# Patient Record
Sex: Female | Born: 1990 | Race: Black or African American | Hispanic: No | Marital: Single | Smoking: Never smoker
Health system: Southern US, Community
[De-identification: ages and names within clinical notes are randomized; demographics above are authoritative.]

## PROBLEM LIST (undated history)

## (undated) DIAGNOSIS — A749 Chlamydial infection, unspecified: Secondary | ICD-10-CM

## (undated) HISTORY — DX: Chlamydial infection, unspecified: A74.9

## (undated) HISTORY — PX: NO PAST SURGERIES: SHX2092

---
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
## 2016-01-20 ENCOUNTER — Ambulatory Visit (INDEPENDENT_AMBULATORY_CARE_PROVIDER_SITE_OTHER): Payer: BLUE CROSS/BLUE SHIELD | Admitting: Nurse Practitioner

## 2016-01-20 ENCOUNTER — Encounter: Payer: Self-pay | Admitting: Nurse Practitioner

## 2016-01-20 VITALS — BP 114/66 | HR 60 | Ht 66.75 in | Wt 174.0 lb

## 2016-01-20 DIAGNOSIS — Z3009 Encounter for other general counseling and advice on contraception: Secondary | ICD-10-CM | POA: Diagnosis not present

## 2016-01-20 DIAGNOSIS — N390 Urinary tract infection, site not specified: Secondary | ICD-10-CM

## 2016-01-20 DIAGNOSIS — N926 Irregular menstruation, unspecified: Secondary | ICD-10-CM

## 2016-01-20 DIAGNOSIS — Z0000 Encounter for general adult medical examination without abnormal findings: Secondary | ICD-10-CM

## 2016-01-20 DIAGNOSIS — R319 Hematuria, unspecified: Secondary | ICD-10-CM

## 2016-01-20 DIAGNOSIS — A749 Chlamydial infection, unspecified: Secondary | ICD-10-CM

## 2016-01-20 DIAGNOSIS — Z113 Encounter for screening for infections with a predominantly sexual mode of transmission: Secondary | ICD-10-CM | POA: Diagnosis not present

## 2016-01-20 DIAGNOSIS — Z01419 Encounter for gynecological examination (general) (routine) without abnormal findings: Secondary | ICD-10-CM | POA: Diagnosis not present

## 2016-01-20 HISTORY — DX: Chlamydial infection, unspecified: A74.9

## 2016-01-20 LAB — POCT URINALYSIS DIPSTICK
BILIRUBIN UA: NEGATIVE
Glucose, UA: NEGATIVE
KETONES UA: NEGATIVE
Nitrite, UA: NEGATIVE
Urobilinogen, UA: NEGATIVE
pH, UA: 8

## 2016-01-20 LAB — POCT URINE PREGNANCY: Preg Test, Ur: NEGATIVE

## 2016-01-20 MED ORDER — NORETHIN ACE-ETH ESTRAD-FE 1-20 MG-MCG PO TABS: 1.0000 | ORAL_TABLET | Freq: Every day | ORAL | 0 refills | Status: DC

## 2016-01-20 MED ORDER — NITROFURANTOIN MONOHYD MACRO 100 MG PO CAPS: 100.0000 mg | ORAL_CAPSULE | Freq: Two times a day (BID) | ORAL | 0 refills | Status: DC

## 2016-01-20 NOTE — Patient Instructions (Signed)
General topics  Next pap or exam is  due in 1 year Take a Women's multivitamin Take 1200 mg. of calcium daily - prefer dietary If any concerns in interim to call back  Breast Self-Awareness Practicing breast self-awareness may pick up problems early, prevent significant medical complications, and possibly save your life. By practicing breast self-awareness, you can become familiar with how your breasts look and feel and if your breasts are changing. This allows you to notice changes early. It can also offer you some reassurance that your breast health is good. One way to learn what is normal for your breasts and whether your breasts are changing is to do a breast self-exam. If you find a lump or something that was not present in the past, it is best to contact your caregiver right away. Other findings that should be evaluated by your caregiver include nipple discharge, especially if it is bloody; skin changes or reddening; areas where the skin seems to be pulled in (retracted); or new lumps and bumps. Breast pain is seldom associated with cancer (malignancy), but should also be evaluated by a caregiver. BREAST SELF-EXAM The best time to examine your breasts is 5 7 days after your menstrual period is over.  Exit[employer] Patient Information 2013 [employer].   Exercise to Stay Healthy Exercise helps you become and stay healthy. EXERCISE IDEAS AND TIPS Choose exercises that:  You enjoy.  Fit into your day. You do not need to exercise really hard to be healthy. You can do exercises at a slow or medium level and stay healthy. You can:  Stretch before and after working out.  Try yoga, Pilates, or tai chi.  Lift weights.  Walk fast, swim, jog, run, climb stairs, bicycle, dance, or rollerskate.  Take aerobic classes. Exercises that burn about 150 calories:  Running 1  miles in 15 minutes.  Playing volleyball for 45 to 60 minutes.  Washing and waxing a car for 45 to 60  minutes.  Playing touch football for 45 minutes.  Walking 1  miles in 35 minutes.  Pushing a stroller 1  miles in 30 minutes.  Playing basketball for 30 minutes.  Raking leaves for 30 minutes.  Bicycling 5 miles in 30 minutes.  Walking 2 miles in 30 minutes.  Dancing for 30 minutes.  Shoveling snow for 15 minutes.  Swimming laps for 20 minutes.  Walking up stairs for 15 minutes.  Bicycling 4 miles in 15 minutes.  Gardening for 30 to 45 minutes.  Jumping rope for 15 minutes.  Washing windows or floors for 45 to 60 minutes. Document Released: 07/09/2010 Document Revised: 08/29/2011 Document Reviewed: 07/09/2010 [employer] Patient Information 2013 [employer].   Other topics ( that may be useful information):    Sexually Transmitted Disease Sexually transmitted disease (STD) refers to any infection that is passed from person to person during sexual activity. This may happen by way of saliva, semen, blood, vaginal mucus, or urine. Common STDs include:  Gonorrhea.  Chlamydia.  Syphilis.  HIV/AIDS.  Genital herpes.  Hepatitis B and C.  Trichomonas.  Human papillomavirus (HPV).  Pubic lice. CAUSES  An STD may be spread by bacteria, virus, or parasite. A person can get an STD by:  Sexual intercourse with an infected person.  Sharing sex toys with an infected person.  Sharing needles with an infected person.  Having intimate contact with the genitals, mouth, or rectal areas of an infected person. SYMPTOMS  Some people may not have any symptoms, but   they can still pass the infection to others. Different STDs have different symptoms. Symptoms include:  Painful or bloody urination.  Pain in the pelvis, abdomen, vagina, anus, throat, or eyes.  Skin rash, itching, irritation, growths, or sores (lesions). These usually occur in the genital or anal area.  Abnormal vaginal discharge.  Penile discharge in men.  Soft, flesh-colored skin growths in the  genital or anal area.  Fever.  Pain or bleeding during sexual intercourse.  Swollen glands in the groin area.  Yellow skin and eyes (jaundice). This is seen with hepatitis. DIAGNOSIS  To make a diagnosis, your caregiver may:  Take a medical history.  Perform a physical exam.  Take a specimen (culture) to be examined.  Examine a sample of discharge under a microscope.  Perform blood test TREATMENT   Chlamydia, gonorrhea, trichomonas, and syphilis can be cured with antibiotic medicine.  Genital herpes, hepatitis, and HIV can be treated, but not cured, with prescribed medicines. The medicines will lessen the symptoms.  Genital warts from HPV can be treated with medicine or by freezing, burning (electrocautery), or surgery. Warts may come back.  HPV is a virus and cannot be cured with medicine or surgery.However, abnormal areas may be followed very closely by your caregiver and may be removed from the cervix, vagina, or vulva through office procedures or surgery. If your diagnosis is confirmed, your recent sexual partners need treatment. This is true even if they are symptom-free or have a negative culture or evaluation. They should not have sex until their caregiver says it is okay. HOME CARE INSTRUCTIONS  All sexual partners should be informed, tested, and treated for all STDs.  Take your antibiotics as directed. Finish them even if you start to feel better.  Only take over-the-counter or prescription medicines for pain, discomfort, or fever as directed by your caregiver.  Rest.  Eat a balanced diet and drink enough fluids to keep your urine clear or pale yellow.  Do not have sex until treatment is completed and you have followed up with your caregiver. STDs should be checked after treatment.  Keep all follow-up appointments, Pap tests, and blood tests as directed by your caregiver.  Only use latex condoms and water-soluble lubricants during sexual activity. Do not use  petroleum jelly or oils.  Avoid alcohol and illegal drugs.  Get vaccinated for HPV and hepatitis. If you have not received these vaccines in the past, talk to your caregiver about whether one or both might be right for you.  Avoid risky sex practices that can break the skin. The only way to avoid getting an STD is to avoid all sexual activity.Latex condoms and dental dams (for oral sex) will help lessen the risk of getting an STD, but will not completely eliminate the risk. SEEK MEDICAL CARE IF:   You have a fever.  You have any new or worsening symptoms. Document Released: 08/27/2002 Document Revised: 08/29/2011 Document Reviewed: 09/03/2010 [facility] Patient Information 2013 [place].    Domestic Abuse You are being battered or abused if someone close to you hits, pushes, or physically hurts you in any way. You also are being abused if you are forced into activities. You are being sexually abused if you are forced to have sexual contact of any kind. You are being emotionally abused if you are made to feel worthless or if you are constantly threatened. It is important to remember that help is available. No one has the right to abuse you. PREVENTION OF FURTHER  ABUSE  Learn the warning signs of danger. This varies with situations but may include: the use of alcohol, threats, isolation from friends and family, or forced sexual contact. Leave if you feel that violence is going to occur.  If you are attacked or beaten, report it to the police so the abuse is documented. You do not have to press charges. The police can protect you while you or the attackers are leaving. Get the officer's name and badge number and a copy of the report.  Find someone you can trust and tell them what is happening to you: your caregiver, a nurse, clergy member, close friend or family member. Feeling ashamed is natural, but remember that you have done nothing wrong. No one deserves abuse. Document Released:  06/03/2000 Document Revised: 08/29/2011 Document Reviewed: 08/12/2010 [employer] Patient Information 2013 [employer].    How Much is Too Much Alcohol? Drinking too much alcohol can cause injury, accidents, and health problems. These types of problems can include:   Car crashes.  Falls.  Family fighting (domestic violence).  Drowning.  Fights.  Injuries.  Burns.  Damage to certain organs.  Having a baby with birth defects. ONE DRINK CAN BE TOO MUCH WHEN YOU ARE:  Working.  Pregnant or breastfeeding.  Taking medicines. Ask your doctor.  Driving or planning to drive. If you or someone you know has a drinking problem, get help from a doctor.  Document Released: 04/02/2009 Document Revised: 08/29/2011 Document Reviewed: 04/02/2009 [employer] Patient Information 2013 [employer].   Smoking Hazards Smoking cigarettes is extremely bad for your health. Tobacco smoke has over 200 known poisons in it. There are over 60 chemicals in tobacco smoke that cause cancer. Some of the chemicals found in cigarette smoke include:   Cyanide.  Benzene.  Formaldehyde.  Methanol (wood alcohol).  Acetylene (fuel used in welding torches).  Ammonia. Cigarette smoke also contains the poisonous gases nitrogen oxide and carbon monoxide.  Cigarette smokers have an increased risk of many serious medical problems and Smoking causes approximately:  90% of all lung cancer deaths in men.  80% of all lung cancer deaths in women.  90% of deaths from chronic obstructive lung disease. Compared with nonsmokers, smoking increases the risk of:  Coronary heart disease by 2 to 4 times.  Stroke by 2 to 4 times.  Men developing lung cancer by 23 times.  Women developing lung cancer by 13 times.  Dying from chronic obstructive lung diseases by 12 times.  . Smoking is the most preventable cause of death and disease in our society.  WHY IS SMOKING ADDICTIVE?  Nicotine is the chemical  agent in tobacco that is capable of causing addiction or dependence.  When you smoke and inhale, nicotine is absorbed rapidly into the bloodstream through your lungs. Nicotine absorbed through the lungs is capable of creating a powerful addiction. Both inhaled and non-inhaled nicotine may be addictive.  Addiction studies of cigarettes and spit tobacco show that addiction to nicotine occurs mainly during the teen years, when young people begin using tobacco products. WHAT ARE THE BENEFITS OF QUITTING?  There are many health benefits to quitting smoking.   Likelihood of developing cancer and heart disease decreases. Health improvements are seen almost immediately.  Blood pressure, pulse rate, and breathing patterns start returning to normal soon after quitting. QUITTING SMOKING   American Lung Association - 1-800-LUNGUSA  American Cancer Society - 1-800-ACS-2345 Document Released: 07/14/2004 Document Revised: 08/29/2011 Document Reviewed: 03/18/2009 [employer] Patient Information 2013 [employer].   Stress Management Stress is a state of physical or mental tension that often results from changes in your life or normal routine. Some common causes of stress are:  Death of a loved one.  Injuries or severe illnesses.  Getting fired or changing jobs.  Moving into a new home. Other causes may be:  Sexual problems.  Business or financial losses.  Taking on a large debt.  Regular conflict with someone at home or at work.  Constant tiredness from lack of sleep. It is not just bad things that are stressful. It may be stressful to:  Win the lottery.  Get married.  Buy a new car. The amount of stress that can be easily tolerated varies from person to person. Changes generally cause stress, regardless of the types of change. Too much stress can affect your health. It may lead to physical or emotional problems. Too little stress (boredom) may also become stressful. SUGGESTIONS TO  REDUCE STRESS:  Talk things over with your family and friends. It often is helpful to share your concerns and worries. If you feel your problem is serious, you may want to get help from a professional counselor.  Consider your problems one at a time instead of lumping them all together. Trying to take care of everything at once may seem impossible. List all the things you need to do and then start with the most important one. Set a goal to accomplish 2 or 3 things each day. If you expect to do too many in a single day you will naturally fail, causing you to feel even more stressed.  Do not use alcohol or drugs to relieve stress. Although you may feel better for a short time, they do not remove the problems that caused the stress. They can also be habit forming.  Exercise regularly - at least 3 times per week. Physical exercise can help to relieve that "uptight" feeling and will relax you.  The shortest distance between despair and hope is often a good night's sleep.  Go to bed and get up on time allowing yourself time for appointments without being rushed.  Take a short "time-out" period from any stressful situation that occurs during the day. Close your eyes and take some deep breaths. Starting with the muscles in your face, tense them, hold it for a few seconds, then relax. Repeat this with the muscles in your neck, shoulders, hand, stomach, back and legs.  Take good care of yourself. Eat a balanced diet and get plenty of rest.  Schedule time for having fun. Take a break from your daily routine to relax. HOME CARE INSTRUCTIONS   Call if you feel overwhelmed by your problems and feel you can no longer manage them on your own.  Return immediately if you feel like hurting yourself or someone else. Document Released: 11/30/2000 Document Revised: 08/29/2011 Document Reviewed: 07/23/2007 [employer] Patient Information 2013 [employer].  

## 2016-01-20 NOTE — Progress Notes (Deleted)
Patient ID: Tamara Stark, female   DOB: 22-May-1991, 25 y.o.   MRN: [id number]  25 y.o. G0P0000 Single  African American Fe here for NGYN annual exam.  Normal menses is regular and last 6-7 days.  Heavy for 2 days using both super tampon an pad and changing every 2 hours. Some cramps and relief with OTC NSAID's.  Some PMS.  In the past 2014/2015 she has used POP and had bleeding daily for a month.  In 07/2015 she tried Trisprintec which gave her a HA.  Patient's last menstrual period was 12/30/2015 (exact date).          Sexually active: Yes.   First sexual activity at 52 yo, 4 partners in lifetime. The current method of family planning is condoms most of the time.    Exercising: No.  The patient does not participate in regular exercise at present. Smoker:  no  Health Maintenance: Pap:  06/2015, normal, no history of abnormal TDaP:  Summer 2012 HIV: 2017, but would like testing today Labs: to be drawn  Urine: 2+ leuk's, trace protein, large RBC   UPT: negative   reports that she has never smoked. She does not have any smokeless tobacco history on file. She reports that she does not drink alcohol or use drugs.  No past medical history on file.  No past surgical history on file.  Current Outpatient Prescriptions  Medication Sig Dispense Refill  . acetaminophen (TYLENOL) 500 MG tablet Take 500-1,000 mg by mouth every 6 (six) hours as needed. For pain.     No current facility-administered medications for this visit.     No family history on file.  ROS:  Pertinent items are noted in HPI.  Otherwise, a comprehensive ROS was negative.  Exam:   BP 114/66 (BP Location: Right Arm, Patient Position: Sitting, Cuff Size: Normal)   Pulse 60   Ht 5' 6.75" (1.695 m)   Wt 174 lb (78.9 kg)   LMP 12/30/2015 (Exact Date)   BMI 27.46 kg/m  Height: 5' 6.75" (169.5 cm) Ht Readings from Last 3 Encounters:  01/20/16 5' 6.75" (1.695 m)    General appearance: alert, cooperative and appears stated  age Head: Normocephalic, without obvious abnormality, atraumatic Neck: no adenopathy, supple, symmetrical, trachea midline and thyroid normal to inspection and palpation Lungs: clear to auscultation bilaterally Breasts: normal appearance, no masses or tenderness Heart: regular rate and rhythm Abdomen: soft, non-tender; no masses,  no organomegaly Extremities: extremities normal, atraumatic, no cyanosis or edema Skin: Skin color, texture, turgor normal. No rashes or lesions Lymph nodes: Cervical, supraclavicular, and axillary nodes normal. No abnormal inguinal nodes palpated Neurologic: Grossly normal   Pelvic: External genitalia:  no lesions              Urethra:  normal appearing urethra with no masses, tenderness or lesions              Bartholin's and Skene's: normal                 Vagina: normal appearing vagina with normal color and discharge, no lesions              Cervix: anteverted              Pap taken: No. Bimanual Exam:  Uterus:  normal size, contour, position, consistency, mobility, non-tender              Adnexa: no mass, fullness, tenderness, no pain at adnexa but tender over the  bladder               Rectovaginal: Confirms               Anus:  normal sphincter tone, no lesions  Chaperone present: yes  A:  Well Woman with normal exam  Condoms occasionally for birth control  R/O STD's  Counseling for birth control options  Pelvic pain with UTI  P:   Reviewed health and wellness pertinent to exam  Pap smear not done as this was done at PCP 06/2015  Counseled with methods of birth control including OCP, POP, Nuva Ring, IUD, Nexplanon, Depo Provera.  She has taken OCP in the past and feels most comfortable with this.  RX for Loestrin Fe 1/20 with potential SE and risk,  She is advised of BUM for the first month. She will return in 3 months for a consult and BP check.  She will call earlier if any symptoms of increased HA's, etc  Will follow with test results and urine  culture.  She is started on Macrobid for UTI pending results.  She will increase po fluids.  Counseled on breast self exam, STD prevention, HIV risk factors and prevention, use and side effects of OCP's, adequate intake of calcium and vitamin D, diet and exercise return annually or prn  An After Visit Summary was printed and given to the patient.

## 2016-01-21 ENCOUNTER — Other Ambulatory Visit: Payer: Self-pay | Admitting: Certified Nurse Midwife

## 2016-01-21 DIAGNOSIS — B9689 Other specified bacterial agents as the cause of diseases classified elsewhere: Secondary | ICD-10-CM

## 2016-01-21 DIAGNOSIS — N760 Acute vaginitis: Principal | ICD-10-CM

## 2016-01-21 LAB — URINALYSIS, MICROSCOPIC ONLY
Bacteria, UA: NONE SEEN [HPF]
CASTS: NONE SEEN [LPF]
Crystals: NONE SEEN [HPF]
RBC / HPF: NONE SEEN RBC/HPF (ref <=2)
Squamous Epithelial / LPF: NONE SEEN [HPF] (ref <=5)
Yeast: NONE SEEN [HPF]

## 2016-01-21 LAB — WET PREP BY MOLECULAR PROBE
Candida species: NEGATIVE
GARDNERELLA VAGINALIS: POSITIVE — AB
TRICHOMONAS VAG: NEGATIVE

## 2016-01-21 LAB — STD PANEL
HEP B S AG: NEGATIVE
HIV: NONREACTIVE

## 2016-01-21 MED ORDER — METRONIDAZOLE 0.75 % VA GEL: 1.0000 | Freq: Two times a day (BID) | VAGINAL | 0 refills | Status: DC

## 2016-01-22 ENCOUNTER — Encounter: Payer: Self-pay | Admitting: Nurse Practitioner

## 2016-01-22 ENCOUNTER — Telehealth: Payer: Self-pay

## 2016-01-22 LAB — IPS N GONORRHOEA AND CHLAMYDIA BY PCR

## 2016-01-22 MED ORDER — AZITHROMYCIN 1 G PO PACK: 1.0000 | PACK | Freq: Once | ORAL | 0 refills | Status: AC

## 2016-01-22 NOTE — Telephone Encounter (Signed)
-----   Message from Kem Boroughs, [place] sent at 01/22/2016 12:53 PM EDT ----- Please call pt with her positive Chlamydia test results and send in HD card, notify partner(s), return for TOC, etc.  She will need RX for Azithromycin.  Other test results have already been sent.

## 2016-01-22 NOTE — Progress Notes (Signed)
Patient ID: Tamara Stark, female   DOB: 08-22-1990, 25 y.o.   MRN: [id number]  25 y.o. G0P0000 Single  African American Fe here for NGYN annual exam.  Normal menses is regular and last 6-7 days.  Heavy for 2 days using both super tampon an pad and changing every 2 hours. Some cramps and relief with OTC NSAID's.  Some PMS.  In the past 2014/2015 she has used POP and had bleeding daily for a month.  In 07/2015 she tried Trisprintec which gave her a HA.  Patient's last menstrual period was 12/30/2015 (exact date).          Sexually active: Yes.   First sexual activity at 71 yo, 4 partners in lifetime. The current method of family planning is condoms most of the time.    Exercising: No.  The patient does not participate in regular exercise at present. Smoker:  no  Health Maintenance: Pap:  06/2015, normal, no history of abnormal TDaP:  Summer 2012 HIV: 2017, but would like testing today Labs: to be drawn  Urine: 2+ leuk's, trace protein, large RBC   UPT: negative   reports that she has never smoked. She does not have any smokeless tobacco history on file. She reports that she does not drink alcohol or use drugs.  No past medical history on file.  No past surgical history on file.  Current Outpatient Prescriptions  Medication Sig Dispense Refill  . acetaminophen (TYLENOL) 500 MG tablet Take 500-1,000 mg by mouth every 6 (six) hours as needed. For pain.     No current facility-administered medications for this visit.     No family history on file.  ROS:  Pertinent items are noted in HPI.  Otherwise, a comprehensive ROS was negative.  Exam:   BP 114/66 (BP Location: Right Arm, Patient Position: Sitting, Cuff Size: Normal)   Pulse 60   Ht 5' 6.75" (1.695 m)   Wt 174 lb (78.9 kg)   LMP 12/30/2015 (Exact Date)   BMI 27.46 kg/m  Height: 5' 6.75" (169.5 cm) Ht Readings from Last 3 Encounters:  01/20/16 5' 6.75" (1.695 m)    General appearance: alert, cooperative and appears stated  age Head: Normocephalic, without obvious abnormality, atraumatic Neck: no adenopathy, supple, symmetrical, trachea midline and thyroid normal to inspection and palpation Lungs: clear to auscultation bilaterally Breasts: normal appearance, no masses or tenderness Heart: regular rate and rhythm Abdomen: soft, non-tender; no masses,  no organomegaly Extremities: extremities normal, atraumatic, no cyanosis or edema Skin: Skin color, texture, turgor normal. No rashes or lesions Lymph nodes: Cervical, supraclavicular, and axillary nodes normal. No abnormal inguinal nodes palpated Neurologic: Grossly normal   Pelvic: External genitalia:  no lesions              Urethra:  normal appearing urethra with no masses, tenderness or lesions              Bartholin's and Skene's: normal                 Vagina: normal appearing vagina with normal color and discharge, no lesions              Cervix: anteverted              Pap taken: No. Bimanual Exam:  Uterus:  normal size, contour, position, consistency, mobility, non-tender              Adnexa: no mass, fullness, tenderness, no pain at adnexa but tender over the  bladder               Rectovaginal: Confirms               Anus:  normal sphincter tone, no lesions  Chaperone present: yes  A:  Well Woman with normal exam  Condoms occasionally for birth control  R/O STD's  Counseling for birth control options  Pelvic pain with UTI  P:   Reviewed health and wellness pertinent to exam  Pap smear not done as this was done at PCP 06/2015  Counseled with methods of birth control including OCP, POP, Nuva Ring, IUD, Nexplanon, Depo Provera.  She has taken OCP in the past and feels most comfortable with this.  RX for Loestrin Fe 1/20 with potential SE and risk,  She is advised of BUM for the first month. She will return in 3 months for a consult and BP check.  She will call earlier if any symptoms of increased HA's, etc  Will follow with test results and urine  culture.  She is started on Macrobid for UTI pending results.  She will increase po fluids.  Counseled on breast self exam, STD prevention, HIV risk factors and prevention, use and side effects of OCP's, adequate intake of calcium and vitamin D, diet and exercise return annually or prn  An After Visit Summary was printed and given to the patient.

## 2016-01-22 NOTE — Progress Notes (Signed)
Encounter reviewed Aradhana Gin, MD   

## 2016-01-22 NOTE — Telephone Encounter (Signed)
Spoke with patient. Advised of message as seen below from Kem Boroughs, [place]. Patient is agreeable and verbalizes understanding. Aware her partner(s) will need to be treated. Advised she should abstain from intercourse for 1 week after she and her partner have been treated. Rx for Azithromycin 1 gram once sent to pharmacy on file. Will need to use condoms with future intercourse. 3 month recheck is scheduled for 04/22/2016 at 10:15 am with Kem Boroughs, FNP. [facility] department form faxed with cover sheet and confirmation to [phone]. Health department form to Kem Boroughs, FNP for review and signature to send to scan.  Routing to provider for final review. Patient agreeable to disposition. Will close encounter.

## 2016-01-23 LAB — URINE CULTURE: Colony Count: >=100000

## 2016-01-25 ENCOUNTER — Telehealth: Payer: Self-pay

## 2016-01-25 NOTE — Telephone Encounter (Signed)
Spoke with patient. Advised of message as seen below from Kem Boroughs, [place]. She is agreeable. Appointment scheduled for 03/04/2016 at 1 pm with Kem Boroughs, FNP. She is agreeable to date and time.  Routing to provider for final review. Patient agreeable to disposition. Will close encounter.

## 2016-01-25 NOTE — Telephone Encounter (Signed)
She can have a TOC in 6 weeks if that fits her schedule.

## 2016-01-25 NOTE — Telephone Encounter (Signed)
Spoke with patient. Advised of results as seen below from Kem Boroughs, [place]. She is agreeable and verbalizes understanding. Patient reports she is feeling much better since starting her antibiotic. Patient also completed her dose of Azithromycin. Patient is concerned about waiting 3 months for a recheck and would like to be seen earlier. Advised I will speak with Kem Boroughs, FNP regarding recommendations on scheduling an earlier appointment and return call. She is agreeable.  Notes Recorded by Graylon Good, CMA on 01/25/2016 at 9:17 AM EDT Pt notified of urine culture results via [phone] (Mobile) *Preferred* per DPR. Pt advised in message to call with any questions. ------  Notes Recorded by Kem Boroughs, FNP on 01/24/2016 at 8:46 PM EDT Please let pt know that she did have some infection in her urine and th antibiotic she is taking is the correct one, Finish all of med'. ------

## 2016-01-27 NOTE — Progress Notes (Signed)
Encounter reviewed Jill Jertson, MD   

## 2016-03-02 ENCOUNTER — Ambulatory Visit (INDEPENDENT_AMBULATORY_CARE_PROVIDER_SITE_OTHER): Payer: BLUE CROSS/BLUE SHIELD | Admitting: Nurse Practitioner

## 2016-03-02 ENCOUNTER — Encounter: Payer: Self-pay | Admitting: Nurse Practitioner

## 2016-03-02 VITALS — BP 116/74 | HR 64 | Ht 66.75 in | Wt 177.0 lb

## 2016-03-02 DIAGNOSIS — Z113 Encounter for screening for infections with a predominantly sexual mode of transmission: Secondary | ICD-10-CM | POA: Diagnosis not present

## 2016-03-02 NOTE — Patient Instructions (Signed)
Chlamydia Test WHY AM I HAVING THIS TEST? This a test to see if you have chlamydia. Chlamydia is a common sexually transmitted disease (STD). Your health care provider may perform this test if you:  Are sexually active.  Have another STD.  Have complaints about pelvic pain, vaginal discharge, or both. WHAT KIND OF SAMPLE IS TAKEN? Depending on your symptoms, your health care provider may collect any one of the following samples:  A blood sample. This is usually collected by inserting a needle into a vein.  A tissue sample. This is collected by swabbing tissue of the eye, urethra, or cervix.  A sample of sputum. This is collected by having you cough into a sterile container that is provided by the lab. HOW DO I PREPARE FOR THE TEST? There is no preparation required for this test. HOW ARE THE TEST RESULTS REPORTED? Your test results will be reported as either positive or negative. It is your responsibility to obtain your test results. Ask the lab or department performing the test when and how you will get your results. WHAT DO THE RESULTS MEAN? A positive result means that you have a chlamydia infection. Talk with your health care provider to discuss your results, treatment options, and if necessary, the need for more tests. Talk with your health care provider if you have any questions about your results.   This information is not intended to replace advice given to you by your health care provider. Make sure you discuss any questions you have with your health care provider.   Document Released: 06/29/2004 Document Revised: 06/27/2014 Document Reviewed: 10/30/2013 Elsevier Interactive Patient Education [employer].

## 2016-03-02 NOTE — Progress Notes (Signed)
25 y.o. Single African American female G0P0000 here for TOC she had AEX 01/20/16 and positive for Chlamydia.   Contraception is Junel 1/20 and states she is compliant to pills.  She did use BUM for birth control.  She had her LMP 02/16/16 then after restarting a new pack of pills has had BTB daily that is just spotting.  This is her second pack of OCP.   O:  Healthy female WDWN Affect: normal, orientation x 3  Exam: no distress Abdomen:  Soft and non tender Lymph node: no enlargement or tenderness Pelvic exam: External genital: normal female BUS: negative Vagina: brown discharge noted.  GC & Chl is obtained Cervix: normal, non tender, no CMT Uterus: normal, non tender Adnexa:normal, non tender, no masses or fullness noted    A: TOC Chlamydia  BTB on second pack of OCP.   P: Will follow up with GC and CHL results. If the chlamydia is negative will change OCP to Junel 1.5/30 to reduce the BTB.  RV prn

## 2016-03-04 ENCOUNTER — Ambulatory Visit: Payer: BLUE CROSS/BLUE SHIELD | Admitting: Nurse Practitioner

## 2016-03-04 LAB — IPS N GONORRHOEA AND CHLAMYDIA BY PCR

## 2016-03-06 NOTE — Progress Notes (Signed)
Encounter reviewed by Dr. Brook Amundson C. Silva.  

## 2016-04-20 ENCOUNTER — Ambulatory Visit (INDEPENDENT_AMBULATORY_CARE_PROVIDER_SITE_OTHER): Payer: BLUE CROSS/BLUE SHIELD | Admitting: Nurse Practitioner

## 2016-04-20 ENCOUNTER — Encounter: Payer: Self-pay | Admitting: Nurse Practitioner

## 2016-04-20 ENCOUNTER — Ambulatory Visit: Payer: BLUE CROSS/BLUE SHIELD | Admitting: Nurse Practitioner

## 2016-04-20 VITALS — BP 108/66 | HR 64 | Ht 66.75 in | Wt 171.0 lb

## 2016-04-20 DIAGNOSIS — Z3009 Encounter for other general counseling and advice on contraception: Secondary | ICD-10-CM | POA: Diagnosis not present

## 2016-04-20 NOTE — Patient Instructions (Addendum)
Check with varius pharmacies about types of non latex condoms. Replens vaginal moisture.

## 2016-04-20 NOTE — Progress Notes (Signed)
25 y.o. Single African American female G0P0000 here for follow up of OCP.  She was started on OCP 01/20/16 with Loestrin Fe 1/20.  Prior she has been on [employer] which gave her HA's.  First pack had BTB.  Continued of OCP until the second pack when she continued with BTB and stopped after the second pack.  LMP 04/11/16 which was normal.  States no further vaginal symptoms since treatment for BV 01/20/16.  She has considered other methods of contraception and does not want to pursue the Nexplanon or IUD at this time.  She declines Nuva Ring.    O: Healthy WD,WN female Affect: normal no distress   A: Conceptive choice discussion    P: Discussed various choices if birth control and she has decide for now to use condoms only.  If she decides later to change will let us know.  Since some condoms cause her to have a yeast infection she is considering non latex condoms.  She gets vaginal dryness afterwards with some condoms and is advised that she could use vaginal lubrication after condom use.   Consult time : 15 minutes.

## 2016-04-22 ENCOUNTER — Ambulatory Visit: Payer: BLUE CROSS/BLUE SHIELD | Admitting: Nurse Practitioner

## 2016-04-24 NOTE — Progress Notes (Signed)
Encounter reviewed by Dr. Brook Amundson C. Silva.  

## 2016-08-29 ENCOUNTER — Encounter (HOSPITAL_COMMUNITY): Payer: Self-pay | Admitting: Oncology

## 2016-08-29 ENCOUNTER — Emergency Department (HOSPITAL_COMMUNITY)
Admission: EM | Admit: 2016-08-29 | Discharge: 2016-08-29 | Disposition: A | Discharge status: Home / Self Care | Payer: BLUE CROSS/BLUE SHIELD | Attending: Emergency Medicine | Admitting: Emergency Medicine

## 2016-08-29 ENCOUNTER — Emergency Department (HOSPITAL_COMMUNITY): Payer: BLUE CROSS/BLUE SHIELD

## 2016-08-29 DIAGNOSIS — A599 Trichomoniasis, unspecified: Secondary | ICD-10-CM | POA: Diagnosis not present

## 2016-08-29 DIAGNOSIS — C569 Malignant neoplasm of unspecified ovary: Secondary | ICD-10-CM | POA: Diagnosis not present

## 2016-08-29 DIAGNOSIS — R1909 Other intra-abdominal and pelvic swelling, mass and lump: Secondary | ICD-10-CM

## 2016-08-29 DIAGNOSIS — R1031 Right lower quadrant pain: Secondary | ICD-10-CM

## 2016-08-29 LAB — URINALYSIS, ROUTINE W REFLEX MICROSCOPIC
BACTERIA UA: NONE SEEN
Bilirubin Urine: NEGATIVE
Glucose, UA: NEGATIVE mg/dL
Ketones, ur: 5 mg/dL — AB
NITRITE: NEGATIVE
PROTEIN: NEGATIVE mg/dL
SPECIFIC GRAVITY, URINE: 1.019 (ref 1.005–1.030)
pH: 6 (ref 5.0–8.0)

## 2016-08-29 LAB — COMPREHENSIVE METABOLIC PANEL
ALT: 11 U/L — ABNORMAL LOW (ref 14–54)
ANION GAP: 8 (ref 5–15)
AST: 19 U/L (ref 15–41)
Albumin: 4.4 g/dL (ref 3.5–5.0)
Alkaline Phosphatase: 28 U/L — ABNORMAL LOW (ref 38–126)
BILIRUBIN TOTAL: 0.5 mg/dL (ref 0.3–1.2)
BUN: 13 mg/dL (ref 6–20)
CO2: 24 mmol/L (ref 22–32)
Calcium: 9.6 mg/dL (ref 8.9–10.3)
Chloride: 107 mmol/L (ref 101–111)
Creatinine, Ser: 0.71 mg/dL (ref 0.44–1.00)
GFR calc Af Amer: >60 mL/min (ref >60)
Glucose, Bld: 81 mg/dL (ref 65–99)
POTASSIUM: 3.6 mmol/L (ref 3.5–5.1)
Sodium: 139 mmol/L (ref 135–145)
TOTAL PROTEIN: 8.2 g/dL — AB (ref 6.5–8.1)

## 2016-08-29 LAB — CBC
HEMATOCRIT: 38 % (ref 36.0–46.0)
HEMOGLOBIN: 12.7 g/dL (ref 12.0–15.0)
MCH: 29.5 pg (ref 26.0–34.0)
MCHC: 33.4 g/dL (ref 30.0–36.0)
MCV: 88.2 fL (ref 78.0–100.0)
Platelets: 372 10*3/uL (ref 150–400)
RBC: 4.31 MIL/uL (ref 3.87–5.11)
RDW: 13.7 % (ref 11.5–15.5)
WBC: 9.4 10*3/uL (ref 4.0–10.5)

## 2016-08-29 LAB — LIPASE, BLOOD: Lipase: 18 U/L (ref 11–51)

## 2016-08-29 LAB — WET PREP, GENITAL
Sperm: NONE SEEN
Yeast Wet Prep HPF POC: NONE SEEN

## 2016-08-29 LAB — GC/CHLAMYDIA PROBE AMP (~~LOC~~) NOT AT ARMC
CHLAMYDIA, DNA PROBE: NEGATIVE
NEISSERIA GONORRHEA: NEGATIVE

## 2016-08-29 LAB — POC URINE PREG, ED: PREG TEST UR: NEGATIVE

## 2016-08-29 MED ORDER — FENTANYL CITRATE (PF) 100 MCG/2ML IJ SOLN
50.0000 ug | Freq: Once | INTRAMUSCULAR | Status: AC
  Administered 2016-08-29: 50 ug via INTRAVENOUS
  Filled 2016-08-29: qty 2

## 2016-08-29 MED ORDER — HYDROCODONE-ACETAMINOPHEN 5-325 MG PO TABS: 1.0000 | ORAL_TABLET | Freq: Four times a day (QID) | ORAL | 0 refills | Status: AC | PRN

## 2016-08-29 MED ORDER — IOPAMIDOL (ISOVUE-300) INJECTION 61%
INTRAVENOUS | Status: AC
  Filled 2016-08-29: qty 100

## 2016-08-29 MED ORDER — IOPAMIDOL (ISOVUE-300) INJECTION 61%
100.0000 mL | Freq: Once | INTRAVENOUS | Status: AC | PRN
  Administered 2016-08-29: 100 mL via INTRAVENOUS

## 2016-08-29 MED ORDER — METRONIDAZOLE 500 MG PO TABS
2000.0000 mg | ORAL_TABLET | Freq: Once | ORAL | Status: AC
  Administered 2016-08-29: 2000 mg via ORAL
  Filled 2016-08-29: qty 4

## 2016-08-29 NOTE — ED Notes (Signed)
Patient returned from CT

## 2016-08-29 NOTE — ED Triage Notes (Signed)
Pt c/o RLQ abdominal pain x 6 hours.  Denies N/V.  Pt states the pain stays constant w/ intermittent sudden sharp spikes in pain.

## 2016-08-29 NOTE — ED Provider Notes (Signed)
[facility] DEPT Provider Note   CSN: [id number] Arrival date & time: 08/29/16  0256     History   Chief Complaint Chief Complaint  Patient presents with  . Abdominal Pain    HPI Tamara Stark is a 26 y.o. female with a hx of chlamydia presents to the Emergency Department complaining of gradual, persistent, progressively worsening Suprapubic and right lower quadrant abdominal pain onset around 9 PM tonight. Patient reports she lays flat on her abdominal creases to a 10/10. She reports that sitting up improves the pain. No treatments prior to arrival. Patient denies nausea or vomiting. Last BM was several hours prior to arrival and normal. Patient denies melena or hematochezia. She reports a last urinary 48 was here in the department. She states moderate amount without pain, dysuria or hematuria. He denies fever, chills, chest pain, shortness of breath, abdominal pain. She denies history of abdominal surgeries.  She denies history of pregnancy. She is currently sexually active with 1 partner. No birth control.     The history is provided by the patient and medical records. No language interpreter was used.    Past Medical History:  Diagnosis Date  . Chlamydia 01/20/2016    There are no active problems to display for this patient.   Past Surgical History:  Procedure Laterality Date  . NO PAST SURGERIES      OB History    Gravida Para Term Preterm AB Living   0 0 0 0 0 0   SAB TAB Ectopic Multiple Live Births   0 0 0 0 0       Home Medications    Prior to Admission medications   Medication Sig Start Date End Date Taking? Authorizing Provider  HYDROcodone-acetaminophen (NORCO/VICODIN) 5-325 MG tablet Take 1 tablet by mouth every 6 (six) hours as needed for moderate pain or severe pain. 08/29/16   Jarrett Soho Nakyia Dau, PA-C    Family History Family History  Problem Relation Age of Onset  . Diabetes Maternal Grandmother   . Heart disease Maternal Grandmother   .  Diabetes Maternal Grandfather   . Heart attack Other     Social History Social History  Substance Use Topics  . Smoking status: Never Smoker  . Smokeless tobacco: Never Used  . Alcohol use Yes     Comment: occasional     Allergies   Orange fruit [citrus]   Review of Systems Review of Systems  Gastrointestinal: Positive for abdominal pain.  All other systems reviewed and are negative.    Physical Exam Updated Vital Signs BP 125/81 (BP Location: Left Arm)   Pulse 82   Temp 98.1 F (36.7 C) (Oral)   Resp 15   Ht 5\' 7"  (1.702 m)   Wt 77.1 kg   LMP 08/19/2016 (Exact Date)   SpO2 100%   BMI 26.63 kg/m   Physical Exam  Constitutional: She appears well-developed and well-nourished. No distress.  Awake, alert, nontoxic appearance  HENT:  Head: Normocephalic and atraumatic.  Mouth/Throat: Oropharynx is clear and moist. No oropharyngeal exudate.  Eyes: Conjunctivae are normal. No scleral icterus.  Neck: Normal range of motion. Neck supple.  Cardiovascular: Normal rate, regular rhythm, normal heart sounds and intact distal pulses.   No murmur heard. Pulmonary/Chest: Effort normal and breath sounds normal. No respiratory distress. She has no wheezes.  Equal chest expansion  Abdominal: Soft. Bowel sounds are normal. She exhibits mass. She exhibits no distension. There is tenderness in the suprapubic area. There is no rebound and  no guarding. Hernia confirmed negative in the right inguinal area and confirmed negative in the left inguinal area.    Genitourinary: Uterus normal. No labial fusion. There is no rash, tenderness or lesion on the right labia. There is no rash, tenderness or lesion on the left labia. Uterus is not deviated, not enlarged, not fixed and not tender. Cervix exhibits no motion tenderness, no discharge and no friability. Right adnexum displays no mass, no tenderness and no fullness. Left adnexum displays no mass, no tenderness and no fullness. No erythema,  tenderness or bleeding in the vagina. No foreign body in the vagina. No signs of injury around the vagina. Vaginal discharge ( thin, white) found.  Musculoskeletal: Normal range of motion. She exhibits no edema.  Lymphadenopathy:       Right: No inguinal adenopathy present.       Left: No inguinal adenopathy present.  Neurological: She is alert.  Speech is clear and goal oriented Moves extremities without ataxia  Skin: Skin is warm and dry. She is not diaphoretic. No erythema.  Psychiatric: She has a normal mood and affect.  Nursing note and vitals reviewed.    ED Treatments / Results  Labs (all labs ordered are listed, but only abnormal results are displayed) Labs Reviewed  WET PREP, GENITAL - Abnormal; Notable for the following:       Result Value   Trich, Wet Prep PRESENT (*)    Clue Cells Wet Prep HPF POC PRESENT (*)    WBC, Wet Prep HPF POC MANY (*)    All other components within normal limits  COMPREHENSIVE METABOLIC PANEL - Abnormal; Notable for the following:    Total Protein 8.2 (*)    ALT 11 (*)    Alkaline Phosphatase 28 (*)    All other components within normal limits  URINALYSIS, ROUTINE W REFLEX MICROSCOPIC - Abnormal; Notable for the following:    Hgb urine dipstick MODERATE (*)    Ketones, ur 5 (*)    Leukocytes, UA SMALL (*)    Squamous Epithelial / LPF 0-5 (*)    All other components within normal limits  LIPASE, BLOOD  CBC  POC URINE PREG, ED  GC/CHLAMYDIA PROBE AMP ([place]) NOT AT [facility]     Radiology Ct Abdomen Pelvis W Contrast  Result Date: 08/29/2016 CLINICAL DATA:  Initial evaluation for acute right lower quadrant abdominal pain. EXAM: CT ABDOMEN AND PELVIS WITH CONTRAST TECHNIQUE: Multidetector CT imaging of the abdomen and pelvis was performed using the standard protocol following bolus administration of intravenous contrast. CONTRAST:  135mL ISOVUE-300 IOPAMIDOL (ISOVUE-300) INJECTION 61% COMPARISON:  None available. FINDINGS: Lower chest:  Visualized lung bases are clear. Hepatobiliary: Liver within normal limits. Gallbladder normal. No biliary dilatation. Pancreas: Pancreas within normal limits. Spleen: Spleen within normal limits. Adrenals/Urinary Tract: The adrenal glands are normal. Kidneys equal size with symmetric enhancement. No nephrolithiasis, hydronephrosis, or focal enhancing renal mass. No hydroureter. Bladder somewhat compressed by a large pelvic mass, but otherwise unremarkable. Stomach/Bowel: Stomach within normal limits. No evidence for bowel obstruction. Appendix partially visualize within the right lower quadrant, within normal limits without evidence for acute appendicitis. No acute inflammatory changes seen about the bowels. Fluid density noted within the distal colon/rectum. Vascular/Lymphatic: Normal intravascular enhancement seen throughout the intra-abdominal aorta and its branch vessels. No pathologically enlarged intra-abdominal or pelvic lymph nodes identified. Reproductive: Uterus within normal limits. Ovaries not well delineated. There is a large multi septated cystic mass within the central aspect of the pelvis measuring  8.9 x 12.2 x 14.3 cm (AP by transverse by craniocaudad). Lesion is located anterior to the uterus, and superior to the bladder, and invaginates upon the bladder inferiorly. Finding concerning for a primary ovarian neoplasm. Other: No free air identified. No free fluid or ascites. No appreciable peritoneal implants. Musculoskeletal: No acute osseous abnormality. Heterogeneous sclerotic lesion within the left aspect of the L1 vertebral body, somewhat indeterminate, but felt to most likely reflect a benign hemangioma. No other discrete osseous lesions. IMPRESSION: 1. 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 Follow-up with dedicated ultrasound is recommended for complete evaluation. This recommendation follows ACR consensus  guidelines: White Paper of the ACR Incidental Findings Committee II on Adnexal Findings. J Am Coll Radiol [phone]. Additionally, gynecologic consultation is recommended. 2. 17 mm heterogeneous sclerotic lesion within the L1 vertebral body. Lesion is somewhat indeterminate, but felt to most likely reflect a benign hemangioma. Attention at follow-up recommended. Alternatively, this could be further assessed with dedicated MRI of lumbar spine. 3. No other acute intra-abdominal or pelvic process identified. Electronically Signed   By: Jeannine Boga M.D.   On: 08/29/2016 06:04    Procedures Procedures (including critical care time)  EMERGENCY DEPARTMENT ULTRASOUND  Study: Limited Ultrasound of Bladder  INDICATIONS: to assess for urinary retention and/or bladder volume prior to urinary catheter Multiple views of the bladder were obtained in real-time in the transverse and longitudinal planes with a multi-frequency probe.  PERFORMED BY: Myself IMAGES ARCHIVED?: Yes LIMITATIONS:  pain INTERPRETATION: Large Volume   EMERGENCY DEPARTMENT [place] FAST EXAM "Limited Ultrasound of the Abdomen and Pericardium" (FAST Exam).   INDICATIONS:abdominal pain Multiple views of the abdomen and pericardium are obtained with a multi-frequency probe.  PERFORMED BY: Myself IMAGES ARCHIVED?: Yes LIMITATIONS:  None INTERPRETATION:  No abdominal free fluid      Medications Ordered in ED Medications  iopamidol (ISOVUE-300) 61 % injection (not administered)  fentaNYL (SUBLIMAZE) injection 50 mcg (50 mcg Intravenous Given 08/29/16 0355)  fentaNYL (SUBLIMAZE) injection 50 mcg (50 mcg Intravenous Given 08/29/16 0441)  iopamidol (ISOVUE-300) 61 % injection 100 mL (100 mLs Intravenous Contrast Given 08/29/16 0529)     Initial Impression / Assessment and Plan / ED Course  I have reviewed the triage vital signs and the nursing notes.  Pertinent labs & imaging results that were available during my care  of the patient were reviewed by me and considered in my medical decision making (see chart for details).  Clinical Course as of Aug 30 623  Mon Aug 29, 2016  0621 Primary ovarian neoplasm CT ABDOMEN PELVIS W CONTRAST [HM]    Clinical Course User Index [HM] Jarrett Soho Emmily Pellegrin, PA-C    She with right lower quadrant abdominal pain. No rebound or guarding. Doubt appendicitis.  Focal mass in the lower abdomen. Ultrasound shows cystic rupture. 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  Discussed with patient. She will be referred to GYN oncology. Wet prep with trichomonas. Flagyl given in the ED.  Vital signs have been within normal limits. Labs are reassuring.  All findings discussed with patient, questions answered.  She understands the importance of close follow-up with GYN oncology. Reports she will call today to make an appointment.      Final Clinical Impressions(s) / ED Diagnoses   Final diagnoses:  Abdominal mass of other site  Malignant neoplasm of ovary, unspecified laterality ([place])  Right lower quadrant abdominal  pain    New Prescriptions New Prescriptions   HYDROCODONE-ACETAMINOPHEN (NORCO/VICODIN) 5-325 MG TABLET    Take 1 tablet by mouth every 6 (six) hours as needed for moderate pain or severe pain.     Jarrett Soho Jasiyah Poland, PA-C 08/29/16 9147    Rolland Porter, MD 08/29/16 [phone]

## 2016-08-29 NOTE — Discharge Instructions (Signed)
1. Medications: Vicodin for severe pain; take every dose with a stool softener, usual home medications 2. Treatment: rest, drink plenty of fluids,  3. Follow Up: Please followup with GYN Oncology in 2 days for discussion of your diagnoses and further evaluation after today's visit; if you do not have a primary care doctor use the resource guide provided to find one; Please return to the ER for worsening pain, fever, chills, vomiting or other concerns

## 2016-08-29 NOTE — ED Notes (Signed)
Patient transported to CT 

## 2016-08-30 ENCOUNTER — Ambulatory Visit: Payer: BLUE CROSS/BLUE SHIELD | Admitting: Gynecologic Oncology

## 2016-09-01 ENCOUNTER — Ambulatory Visit (HOSPITAL_BASED_OUTPATIENT_CLINIC_OR_DEPARTMENT_OTHER): Payer: BLUE CROSS/BLUE SHIELD

## 2016-09-01 ENCOUNTER — Ambulatory Visit: Payer: BLUE CROSS/BLUE SHIELD | Attending: Gynecologic Oncology | Admitting: Gynecologic Oncology

## 2016-09-01 ENCOUNTER — Encounter: Payer: Self-pay | Admitting: Gynecologic Oncology

## 2016-09-01 VITALS — BP 131/69 | HR 63 | Temp 98.6°F | Resp 20 | Ht 66.73 in | Wt 166.4 lb

## 2016-09-01 DIAGNOSIS — R1900 Intra-abdominal and pelvic swelling, mass and lump, unspecified site: Secondary | ICD-10-CM

## 2016-09-01 HISTORY — PX: OVARIAN CYST SURGERY: SHX726

## 2016-09-01 LAB — LACTATE DEHYDROGENASE: LDH: 165 U/L (ref 125–245)

## 2016-09-01 NOTE — Patient Instructions (Signed)
Dr Everitt Amber is referring you to [facility] for surgery.  The planned procedure is Exploratory Laparatomy unilateral salpingo-oophorectomy and possible staging.   Katharine Look, is the surgical booking coordinator at [facility], she will contact you with a preoperative appointment and the surgical booking date.

## 2016-09-01 NOTE — Progress Notes (Signed)
Consult Note: Gyn-Onc  Consult was requested by Dr. Tomi Bamberger for the evaluation of Tamara Stark 26 y.o. female  CC:  Chief Complaint  Patient presents with  . pelvic mass    Assessment/Plan:  [name]  is a 26 y.o.  year old with a 25cm cystic pelvic (likely ovarian) mass.   It is very large but is smooth, regular, and mostly cystic therefore may be benign. We will draw tumor markers today to evaluate for likelihood of malignancy.  I discussed that I believed that surgery with exploratory laparotomy, unilateral salpingo-oophorectomy, possible staging would be necessary. The size of the incision would be tailored to intraoperative findings and the degree of concern for the safety of controlled cyst aspiration vs in tact resection. I discussed that fertility preservation would be possible if the contralateral ovary and uterus did not appear to be involved if a malignancy is diagnosed.  We will schedule her surgery for [facility] in order to expedite her treatment.   HPI: Tamara Stark is a 26 year old G0 who is seen in consultation at the request of Dr Tomi Bamberger for a 25cm cystic pelvic mass.   The patient is an otherwise healthy 26 year old who developed intermittent right lower quadrant pains and was seen in the ED on 08/29/16.  She then underwent a CT abdo/pelvis which confirmed a 8.9cm x 12.2cm x 14.3cm multiseptated pelvic mass (unable to localize to a side). The lesion is anterior to the uterus. There was no ascites, omental disease or carcinomatosis, or lymphadenopathy.  Since being discharged from the ED she has had intermittent abdominal pains - non severe.  She is otherwise very healthy. 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 She has had no prior surgeries. She is sexually active.  Current Meds:  Outpatient Encounter Prescriptions as of 09/01/2016  Medication Sig  . HYDROcodone-acetaminophen (NORCO/VICODIN) 5-325 MG tablet Take 1 tablet by mouth every 6 (six) hours as needed for moderate  pain or severe pain.   No facility-administered encounter medications on file as of 09/01/2016.     Allergy:  Allergies  Allergen Reactions  . Orange Fruit [Citrus] Shortness Of Breath    Chest tightness    Social Hx:   Social History   Social History  . Marital status: Single    Spouse name: N/A  . Number of children: N/A  . Years of education: N/A   Occupational History  . Not on file.   Social History Main Topics  . Smoking status: Never Smoker  . Smokeless tobacco: Never Used  . Alcohol use Yes     Comment: occasional  . Drug use: No  . Sexual activity: Yes    Birth control/ protection: Condom   Other Topics Concern  . Not on file   Social History Narrative  . No narrative on file    Past Surgical Hx:  Past Surgical History:  Procedure Laterality Date  . NO PAST SURGERIES      Past Medical Hx:  Past Medical History:  Diagnosis Date  . Chlamydia 01/20/2016    Past Gynecological History:  G0, trichomonas diagnosed in March, 2018 - treated with flagyl Patient's last menstrual period was 08/19/2016 (exact date).  Family Hx:  Family History  Problem Relation Age of Onset  . Diabetes Maternal Grandmother   . Heart disease Maternal Grandmother   . Diabetes Maternal Grandfather   . Heart attack Other     Review of Systems:  Constitutional  Feels well,  ENT Normal appearing ears and nares bilaterally Skin/Breast  No rash, sores, jaundice, itching, dryness Cardiovascular  No chest pain, shortness of breath, or edema  Pulmonary  No cough or wheeze.  Gastro Intestinal  No nausea, vomitting, or diarrhoea. No bright red blood per rectum, no abdominal pain, change in bowel movement, or constipation.  Genito Urinary  No frequency, urgency, dysuria, normal menses Musculo Skeletal  No myalgia, arthralgia, joint swelling or pain  Neurologic  No weakness, numbness, change in gait,  Psychology  No depression, anxiety, insomnia.   Vitals:  Blood  pressure 131/69, pulse 63, temperature 98.6 F (37 C), temperature source Oral, resp. rate 20, height 5' 6.73" (1.695 m), weight 166 lb 6.4 oz (75.5 kg), last menstrual period 08/19/2016, SpO2 100 %.  Physical Exam: WD in NAD Neck  Supple NROM, without any enlargements.  Lymph Node Survey No cervical supraclavicular or inguinal adenopathy Cardiovascular  Pulse normal rate, regularity and rhythm. S1 and S2 normal.  Lungs  Clear to auscultation bilateraly, without wheezes/crackles/rhonchi. Good air movement.  Skin  No rash/lesions/breakdown  Psychiatry  Alert and oriented to person, place, and time  Abdomen  Normoactive bowel sounds, abdomen soft, non-tender and thin without evidence of hernia. Umbilical ring in situ. Cystic central fullness filling pelvis and extending to umbilical level. Back No CVA tenderness Genito Urinary  Vulva/vagina: Normal external female genitalia.  No lesions. No discharge or bleeding.  Bladder/urethra:  No lesions or masses, well supported bladder  Vagina: normal  Cervix: Normal appearing, no lesions.  Uterus:  Small, mobile, no parametrial involvement or nodularity. Cystic fullness anterior to uterus extending into abdomen. Minimally mobile Rectal  Good tone, no masses no cul de sac nodularity.  Extremities  No bilateral cyanosis, clubbing or edema.   Donaciano Eva, MD  09/01/2016, 2:51 PM

## 2016-09-02 LAB — CA 125: Cancer Antigen (CA) 125: 16.8 U/mL (ref 0.0–38.1)

## 2016-09-02 LAB — CEA (IN HOUSE-CHCC): CEA (CHCC-In House): 1.21 ng/mL (ref 0.00–5.00)

## 2016-09-02 LAB — AFP TUMOR MARKER: AFP, SERUM, TUMOR MARKER: 4.1 ng/mL (ref 0.0–8.3)

## 2016-09-02 LAB — BETA HCG QUANT (REF LAB): hCG Quant: <1 m[IU]/mL

## 2016-09-06 LAB — INHIBIN B: INHIBIN B: 202.4 pg/mL

## 2017-01-31 ENCOUNTER — Telehealth: Payer: Self-pay | Admitting: Obstetrics and Gynecology

## 2017-01-31 ENCOUNTER — Ambulatory Visit: Payer: BLUE CROSS/BLUE SHIELD | Admitting: Obstetrics and Gynecology

## 2017-01-31 NOTE — Progress Notes (Deleted)
26 y.o. G0P0000 Single {Race/ethnicity:17218} female here for annual exam.    PCP:     No LMP recorded.           Sexually active: {yes no:314532}  The current method of family planning is {contraception:315051}.    Exercising: {yes no:314532}  {types:19826} Smoker:  {YES [id number]  Health Maintenance: Pap:  1/17 neg History of abnormal Pap:  no MMG:  none Colonoscopy:  none BMD:   none   TDaP:  2012 Gardasil:   {YES NO:22349} HIV: neg 2018 Hep C: Screening Labs:     reports that she has never smoked. She has never used smokeless tobacco. She reports that she drinks alcohol. She reports that she does not use drugs.  Past Medical History:  Diagnosis Date  . Chlamydia 01/20/2016    Past Surgical History:  Procedure Laterality Date  . NO PAST SURGERIES      Current Outpatient Prescriptions  Medication Sig Dispense Refill  . HYDROcodone-acetaminophen (NORCO/VICODIN) 5-325 MG tablet Take 1 tablet by mouth every 6 (six) hours as needed for moderate pain or severe pain. 15 tablet 0   No current facility-administered medications for this visit.     Family History  Problem Relation Age of Onset  . Diabetes Maternal Grandmother   . Heart disease Maternal Grandmother   . Diabetes Maternal Grandfather   . Heart attack Other     ROS:  Pertinent items are noted in HPI.  Otherwise, a comprehensive ROS was negative.  Exam:   There were no vitals taken for this visit.    General appearance: alert, cooperative and appears stated age Head: Normocephalic, without obvious abnormality, atraumatic Neck: no adenopathy, supple, symmetrical, trachea midline and thyroid normal to inspection and palpation Lungs: clear to auscultation bilaterally Breasts: normal appearance, no masses or tenderness, No nipple retraction or dimpling, No nipple discharge or bleeding, No axillary or supraclavicular adenopathy Heart: regular rate and rhythm Abdomen: soft, non-tender; no masses, no  organomegaly Extremities: extremities normal, atraumatic, no cyanosis or edema Skin: Skin color, texture, turgor normal. No rashes or lesions Lymph nodes: Cervical, supraclavicular, and axillary nodes normal. No abnormal inguinal nodes palpated Neurologic: Grossly normal  Pelvic: External genitalia:  no lesions              Urethra:  normal appearing urethra with no masses, tenderness or lesions              Bartholins and Skenes: normal                 Vagina: normal appearing vagina with normal color and discharge, no lesions              Cervix: no lesions              Pap taken: {yes no:314532} Bimanual Exam:  Uterus:  normal size, contour, position, consistency, mobility, non-tender              Adnexa: no mass, fullness, tenderness              Rectal exam: {yes no:314532}.  Confirms.              Anus:  normal sphincter tone, no lesions  Chaperone was present for exam.  Assessment:   Well woman visit with normal exam.   Plan: Mammogram screening discussed. Recommended self breast awareness. Pap and HR HPV as above. Guidelines for Calcium, Vitamin D, regular exercise program including cardiovascular and weight bearing exercise.   Follow up  annually and prn.   Additional counseling given.  {yes [id number]. _______ minutes face to face time of which over 50% was spent in counseling.    After visit summary provided.

## 2017-01-31 NOTE — Telephone Encounter (Signed)
Thank you for the update.  Encounter closed. 

## 2017-01-31 NOTE — Telephone Encounter (Signed)
Patient cancelled aex appointment. See staff message

## 2017-03-21 ENCOUNTER — Emergency Department (HOSPITAL_COMMUNITY)
Admission: EM | Admit: 2017-03-21 | Discharge: 2017-03-21 | Disposition: A | Discharge status: Home / Self Care | Payer: No Typology Code available for payment source | Attending: Emergency Medicine | Admitting: Emergency Medicine

## 2017-03-21 ENCOUNTER — Encounter (HOSPITAL_COMMUNITY): Payer: Self-pay | Admitting: Internal Medicine

## 2017-03-21 ENCOUNTER — Emergency Department (HOSPITAL_COMMUNITY): Payer: No Typology Code available for payment source

## 2017-03-21 DIAGNOSIS — Y92410 Unspecified street and highway as the place of occurrence of the external cause: Secondary | ICD-10-CM | POA: Diagnosis not present

## 2017-03-21 DIAGNOSIS — Y9389 Activity, other specified: Secondary | ICD-10-CM | POA: Diagnosis not present

## 2017-03-21 DIAGNOSIS — R1031 Right lower quadrant pain: Secondary | ICD-10-CM | POA: Insufficient documentation

## 2017-03-21 DIAGNOSIS — M542 Cervicalgia: Secondary | ICD-10-CM | POA: Insufficient documentation

## 2017-03-21 DIAGNOSIS — M25561 Pain in right knee: Secondary | ICD-10-CM | POA: Insufficient documentation

## 2017-03-21 DIAGNOSIS — Y999 Unspecified external cause status: Secondary | ICD-10-CM | POA: Diagnosis not present

## 2017-03-21 DIAGNOSIS — M549 Dorsalgia, unspecified: Secondary | ICD-10-CM | POA: Insufficient documentation

## 2017-03-21 MED ORDER — IBUPROFEN 800 MG PO TABS
800.0000 mg | ORAL_TABLET | Freq: Once | ORAL | Status: AC
  Administered 2017-03-21: 800 mg via ORAL
  Filled 2017-03-21: qty 1

## 2017-03-21 MED ORDER — CYCLOBENZAPRINE HCL 10 MG PO TABS: 10.0000 mg | ORAL_TABLET | Freq: Two times a day (BID) | ORAL | 0 refills | Status: AC | PRN

## 2017-03-21 NOTE — Discharge Instructions (Signed)
The x-ray of the right knee did not show fracture.  Please take ibuprofen 800 mg every 6 hours as needed for pain. I have also prescribed you a muscle relaxer medication which will help with back pain. Please note that this medicine can make you drowsy, do not drive or drink alcohol while taking this medication.  Please return to the emergency department if he develops a worsening headache with nausea and vomiting, if you develop worsening abdominal pain with fever or with nausea and vomiting or if you have any new or concerning symptoms.
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
